# Patient Record
Sex: Male | Born: 1955 | Race: Black or African American | Hispanic: No | Marital: Married | Smoking: Current every day smoker
Health system: Southern US, Community
[De-identification: ages and names within clinical notes are randomized; demographics above are authoritative.]

## PROBLEM LIST (undated history)

## (undated) DIAGNOSIS — I10 Essential (primary) hypertension: Secondary | ICD-10-CM

## (undated) HISTORY — PX: SHOULDER SURGERY: SHX246

---

## 2014-06-20 ENCOUNTER — Emergency Department (HOSPITAL_BASED_OUTPATIENT_CLINIC_OR_DEPARTMENT_OTHER): Payer: Worker's Compensation

## 2014-06-20 ENCOUNTER — Encounter (HOSPITAL_BASED_OUTPATIENT_CLINIC_OR_DEPARTMENT_OTHER): Payer: Self-pay | Admitting: Emergency Medicine

## 2014-06-20 ENCOUNTER — Emergency Department (HOSPITAL_BASED_OUTPATIENT_CLINIC_OR_DEPARTMENT_OTHER)
Admission: EM | Admit: 2014-06-20 | Discharge: 2014-06-20 | Disposition: A | Discharge status: Home / Self Care | Payer: Worker's Compensation | Attending: Emergency Medicine | Admitting: Emergency Medicine

## 2014-06-20 DIAGNOSIS — R52 Pain, unspecified: Secondary | ICD-10-CM

## 2014-06-20 DIAGNOSIS — M75102 Unspecified rotator cuff tear or rupture of left shoulder, not specified as traumatic: Secondary | ICD-10-CM | POA: Diagnosis not present

## 2014-06-20 DIAGNOSIS — M25512 Pain in left shoulder: Secondary | ICD-10-CM | POA: Diagnosis present

## 2014-06-20 DIAGNOSIS — Z720 Tobacco use: Secondary | ICD-10-CM | POA: Diagnosis not present

## 2014-06-20 DIAGNOSIS — IMO0002 Reserved for concepts with insufficient information to code with codable children: Secondary | ICD-10-CM

## 2014-06-20 LAB — CBC WITH DIFFERENTIAL/PLATELET
Basophils Absolute: 0 10*3/uL (ref 0.0–0.1)
Basophils Relative: 1 % (ref 0–1)
Eosinophils Absolute: 0.1 10*3/uL (ref 0.0–0.7)
Eosinophils Relative: 2 % (ref 0–5)
HCT: 43.3 % (ref 39.0–52.0)
HEMOGLOBIN: 14.6 g/dL (ref 13.0–17.0)
LYMPHS PCT: 37 % (ref 12–46)
Lymphs Abs: 2.3 10*3/uL (ref 0.7–4.0)
MCH: 29.6 pg (ref 26.0–34.0)
MCHC: 33.7 g/dL (ref 30.0–36.0)
MCV: 87.7 fL (ref 78.0–100.0)
Monocytes Absolute: 0.5 10*3/uL (ref 0.1–1.0)
Monocytes Relative: 8 % (ref 3–12)
NEUTROS ABS: 3.2 10*3/uL (ref 1.7–7.7)
Neutrophils Relative %: 52 % (ref 43–77)
PLATELETS: 268 10*3/uL (ref 150–400)
RBC: 4.94 MIL/uL (ref 4.22–5.81)
RDW: 13.7 % (ref 11.5–15.5)
WBC: 6.1 10*3/uL (ref 4.0–10.5)

## 2014-06-20 LAB — BASIC METABOLIC PANEL
Anion gap: 4 — ABNORMAL LOW (ref 5–15)
BUN: 11 mg/dL (ref 6–20)
CHLORIDE: 110 mmol/L (ref 101–111)
CO2: 27 mmol/L (ref 22–32)
Calcium: 8.9 mg/dL (ref 8.9–10.3)
Creatinine, Ser: 1 mg/dL (ref 0.61–1.24)
GFR calc Af Amer: >60 mL/min (ref >60)
GFR calc non Af Amer: >60 mL/min (ref >60)
Glucose, Bld: 88 mg/dL (ref 65–99)
POTASSIUM: 3.8 mmol/L (ref 3.5–5.1)
SODIUM: 141 mmol/L (ref 135–145)

## 2014-06-20 LAB — TROPONIN I

## 2014-06-20 MED ORDER — ACETAMINOPHEN 500 MG PO TABS
1000.0000 mg | ORAL_TABLET | Freq: Once | ORAL | Status: AC
  Administered 2014-06-20: 1000 mg via ORAL
  Filled 2014-06-20: qty 2

## 2014-06-20 MED ORDER — OXYCODONE-ACETAMINOPHEN 5-325 MG PO TABS: ORAL_TABLET | ORAL | Status: DC

## 2014-06-20 MED ORDER — METHOCARBAMOL 500 MG PO TABS: 1000.0000 mg | ORAL_TABLET | Freq: Four times a day (QID) | ORAL | Status: DC | PRN

## 2014-06-20 NOTE — ED Provider Notes (Signed)
CSN: [id number]     Arrival date & time 06/20/14  1344 History   First MD Initiated Contact with Patient 06/20/14 1405     Chief Complaint  Patient presents with  . Shoulder Pain     (Consider location/radiation/quality/duration/timing/severity/associated sxs/prior Treatment) HPI   Jason Bates is a 59 y.o. male complaining of severe, constant atraumatic left shoulder pain onset yesterday. Patient has taken acetaminophen and Motrin at home with little relief. Patient states pain is exacerbated by movement and palpation. He has a rotator cuff surgery by [facility] in February of this year. Has not had any issues with the shoulder since that time. He has occasional pain but nothing this severe. He denies nausea, vomiting, shortness of breath, cough, fever, history of ACS. Patient is active daily smoker.  History reviewed. No pertinent past medical history. Past Surgical History  Procedure Laterality Date  . Shoulder surgery Left    History reviewed. No pertinent family history. History  Substance Use Topics  . Smoking status: Current Every Day Smoker -- 0.50 packs/day    Types: Cigarettes  . Smokeless tobacco: Not on file  . Alcohol Use: No    Review of Systems  10 systems reviewed and found to be negative, except as noted in the HPI.  Allergies  Review of patient's allergies indicates no known allergies.  Home Medications   Prior to Admission medications   Medication Sig Start Date End Date Taking? Authorizing Provider  methocarbamol (ROBAXIN) 500 MG tablet Take 2 tablets (1,000 mg total) by mouth 4 (four) times daily as needed (Pain). 06/20/14   Calianne Larue, PA-C  oxyCODONE-acetaminophen (PERCOCET/ROXICET) 5-325 MG per tablet 1 to 2 tabs PO q6hrs  PRN for pain 06/20/14   Joni ReiningNicole Markee Matera, PA-C   BP 179/101 mmHg  Pulse 75  Temp(Src) 98.3 F (36.8 C) (Oral)  Resp 18  Ht 5\' 8"  (1.727 m)  Wt 127 lb (57.607 kg)  BMI 19.31 kg/m2  SpO2 95% Physical Exam  Constitutional:  He is oriented to person, place, and time. He appears well-developed and well-nourished. No distress.  crying  HENT:  Head: Normocephalic.  Mouth/Throat: Oropharynx is clear and moist.  Eyes: Conjunctivae and EOM are normal.  Neck: Normal range of motion. No JVD present. No tracheal deviation present.  Cardiovascular: Normal rate, regular rhythm and intact distal pulses.   Radial pulse equal bilaterally  Pulmonary/Chest: Effort normal and breath sounds normal. No stridor. No respiratory distress. He has no wheezes. He has no rales. He exhibits no tenderness.  Abdominal: Soft. He exhibits no distension and no mass. There is no tenderness. There is no rebound and no guarding.  Musculoskeletal: Normal range of motion. He exhibits tenderness. He exhibits no edema.  Left shoulder:  Shoulder with no deformity. Able to abduct to just under 90. +Anterior TTP of rotator cuff musculature. Drop arm negative. Neurovascularly intact  No calf asymmetry, superficial collaterals, palpable cords, edema, Homans sign negative bilaterally.    Neurological: He is alert and oriented to person, place, and time.  Skin: Skin is warm. He is not diaphoretic.  Psychiatric: He has a normal mood and affect.  Nursing note and vitals reviewed.   ED Course  Procedures (including critical care time) Labs Review Labs Reviewed  BASIC METABOLIC PANEL - Abnormal; Notable for the following:    Anion gap 4 (*)    All other components within normal limits  CBC WITH DIFFERENTIAL/PLATELET  TROPONIN I    Imaging Review Dg Chest 2 View  06/20/2014  CLINICAL DATA:  Left shoulder pain. Rotator cuff surgery 4 months ago. Chronic cough.  EXAM: CHEST  2 VIEW  COMPARISON:  None.  FINDINGS: The heart size and mediastinal contours are normal. The lungs are clear. There are prominent nipple shadows bilaterally. No significant osseous findings identified. Postsurgical changes noted in the proximal left humerus.  IMPRESSION: No  active cardiopulmonary process.   Electronically Signed   By: Carey Bullocks M.D.   On: 06/20/2014 15:04   Dg Shoulder Left  06/20/2014   CLINICAL DATA:  Left shoulder pain. Personal history of rotator cuff surgery.  EXAM: LEFT SHOULDER - 2+ VIEW  COMPARISON:  None.  FINDINGS: The left shoulder is located. No acute bone or soft tissue abnormalities are present. Tendon anchors are noted. The visualized left clavicle is intact. The hemi thorax is clear.  IMPRESSION: 1. Postsurgical changes. 2. No acute abnormality.   Electronically Signed   By: Marin Roberts M.D.   On: 06/20/2014 15:06     EKG Interpretation   Date/Time:  Sunday June 20 2014 13:58:51 EDT Ventricular Rate:  71 PR Interval:  182 QRS Duration: 76 QT Interval:  418 QTC Calculation: 454 R Axis:   6 Text Interpretation:  Normal sinus rhythm Right atrial enlargement Cannot  rule out Anteroseptal infarct , age undetermined Abnormal ECG No prior for  comparison Confirmed by Gwendolyn Grant  MD, BLAIR [phone]) on 06/20/2014 2:05:50 PM      MDM   Final diagnoses:  Pain  Disorder of rotator cuff syndrome of left shoulder and allied disorder   Filed Vitals:   06/20/14 1359  BP: 179/101  Pulse: 75  Temp: 98.3 F (36.8 C)  TempSrc: Oral  Resp: 18  Height:  (1.727 m)  Weight: 127 lb (57.607 kg)  SpO2: 95%    Medications  acetaminophen (TYLENOL) tablet 1,000 mg (1,000 mg Oral Given 06/20/14 1501)    Jason Bates is a pleasant 59 y.o. male presenting with severe atraumatic left shoulder pain exacerbated by palpation and movement. Patient had surgery to the affected joint in February of this year. While I think is unlikely that this could be related to cardiac issues, patient is a daily smoker so will check basic blood work, chest x-ray and mage the left shoulder. Patient will be placed in a sling. Patient is driving home so pain Rx options in the ED are limited. His blood pressure significantly elevated and that may be  secondary to pain.  EKG with no acute abnormalities, troponin is negative, blood work is negative, x-rays of the chest and shoulder also negative, will give the patient pain control and ask him to follow with Dr. Luiz Blare for further management. Extensive discussion of return precautions.  Evaluation does not show pathology that would require ongoing emergent intervention or inpatient treatment. Pt is hemodynamically stable and mentating appropriately. Discussed findings and plan with patient/guardian, who agrees with care plan. All questions answered. Return precautions discussed and outpatient follow up given.   New Prescriptions   METHOCARBAMOL (ROBAXIN) 500 MG TABLET    Take 2 tablets (1,000 mg total) by mouth 4 (four) times daily as needed (Pain).   OXYCODONE-ACETAMINOPHEN (PERCOCET/ROXICET) 5-325 MG PER TABLET    1 to 2 tabs PO q6hrs  PRN for pain         Wynetta Emery, PA-C 06/20/14 1536  Elwin Mocha, MD 06/20/14 1537

## 2014-06-20 NOTE — ED Notes (Signed)
Patient reports pain to left shoulder.  Reports he had surgery on it February 9.  Denies injury.  Reports constant pain.

## 2014-06-20 NOTE — Discharge Instructions (Signed)
Please take ibuprofen 400mg  (this is normally 2 over the counter pills) every 6 hours (take with food to minimze stomach irritation).   Take valium and/or percocet for breakthrough pain, do not drink alcohol, drive, care for children or perfom other critical tasks while taking valium and/or percocet.  Please follow with your primary care doctor in the next 2 days for a check-up. They must obtain records for further management.   Do not hesitate to return to the Emergency Department for any new, worsening or concerning symptoms.   Only use the arm sling for up to 2 days. Take the arm out and rotate the shoulder every 4 hours.

## 2014-08-07 ENCOUNTER — Encounter (HOSPITAL_BASED_OUTPATIENT_CLINIC_OR_DEPARTMENT_OTHER): Payer: Self-pay | Admitting: *Deleted

## 2014-08-07 ENCOUNTER — Emergency Department (HOSPITAL_BASED_OUTPATIENT_CLINIC_OR_DEPARTMENT_OTHER)
Admission: EM | Admit: 2014-08-07 | Discharge: 2014-08-07 | Disposition: A | Discharge status: Home / Self Care | Payer: Worker's Compensation | Attending: Emergency Medicine | Admitting: Emergency Medicine

## 2014-08-07 DIAGNOSIS — Z720 Tobacco use: Secondary | ICD-10-CM | POA: Insufficient documentation

## 2014-08-07 DIAGNOSIS — M25512 Pain in left shoulder: Secondary | ICD-10-CM | POA: Diagnosis present

## 2014-08-07 DIAGNOSIS — I10 Essential (primary) hypertension: Secondary | ICD-10-CM | POA: Insufficient documentation

## 2014-08-07 DIAGNOSIS — Z9119 Patient's noncompliance with other medical treatment and regimen: Secondary | ICD-10-CM | POA: Diagnosis not present

## 2014-08-07 DIAGNOSIS — Z9889 Other specified postprocedural states: Secondary | ICD-10-CM | POA: Diagnosis not present

## 2014-08-07 HISTORY — DX: Essential (primary) hypertension: I10

## 2014-08-07 MED ORDER — HYDROCODONE-ACETAMINOPHEN 5-325 MG PO TABS: 1.0000 | ORAL_TABLET | Freq: Four times a day (QID) | ORAL | Status: DC | PRN

## 2014-08-07 NOTE — ED Notes (Signed)
MD at bedside. 

## 2014-08-07 NOTE — ED Notes (Signed)
Pt c/o left shoulder pain x 2 months , seen here last month for same, requesting "pain med" till can get in with ortho

## 2014-08-07 NOTE — ED Provider Notes (Signed)
CSN: [id number]     Arrival date & time 08/07/14  0905 History   First MD Initiated Contact with Patient 08/07/14 [phone]     Chief Complaint  Patient presents with  . Shoulder Pain     (Consider location/radiation/quality/duration/timing/severity/associated sxs/prior Treatment) HPI Complains of left shoulder pain onset after having rotator cuff surgery February 2016. Pain is worse with movement of the shoulder improved with remaining still. No other associated symptoms. He treated himself with ibuprofen and Tylenol, without adequate pain relief. No fever no  Injury.no other associated symptoms pain is nonradiating Past Medical History  Diagnosis Date  . Hypertension    Past Surgical History  Procedure Laterality Date  . Shoulder surgery Left    History reviewed. No pertinent family history. History  Substance Use Topics  . Smoking status: Current Every Day Smoker -- 0.50 packs/day    Types: Cigarettes  . Smokeless tobacco: Not on file  . Alcohol Use: No    Review of Systems  Constitutional: Negative.   Respiratory: Negative.   Cardiovascular: Negative.   Musculoskeletal: Positive for arthralgias.       Left shoulder pain      Allergies  Review of patient's allergies indicates no known allergies.  Home Medications   Prior to Admission medications   Medication Sig Start Date End Date Taking? Authorizing Provider  HYDROcodone-acetaminophen (NORCO) 5-325 MG per tablet Take 1 tablet by mouth every 6 (six) hours as needed. 08/07/14   Doug Sou, MD  methocarbamol (ROBAXIN) 500 MG tablet Take 2 tablets (1,000 mg total) by mouth 4 (four) times daily as needed (Pain). 06/20/14   Nicole Pisciotta, PA-C  oxyCODONE-acetaminophen (PERCOCET/ROXICET) 5-325 MG per tablet 1 to 2 tabs PO q6hrs  PRN for pain 06/20/14   Joni Reining Pisciotta, PA-C   BP 160/118 mmHg  Pulse 76  Temp(Src) 98.2 F (36.8 C) (Oral)  Resp 16  Ht  (1.727 m)  Wt 130 lb (58.968 kg)  BMI 19.77 kg/m2  SpO2  98% Physical Exam  Constitutional: He appears well-developed and well-nourished. No distress.  HENT:  Head: Normocephalic and atraumatic.  Eyes: Conjunctivae are normal. Pupils are equal, round, and reactive to light.  Neck: Neck supple. No tracheal deviation present. No thyromegaly present.  Cardiovascular: Normal rate and regular rhythm.   No murmur heard. Pulmonary/Chest: Effort normal and breath sounds normal.  Abdominal: Soft. Bowel sounds are normal. He exhibits no distension. There is no tenderness.  Musculoskeletal: Normal range of motion. He exhibits no edema or tenderness.  Left upper extremity, no swelling no redness limited abduction of shoulder secondary to pain. He has full flexion, adduction, and extension with some pain on movement. Radial pulse 2+. All other extremities without redness swelling or tenderness neurovascularly intact  Neurological: He is alert. Coordination normal.  Skin: Skin is warm and dry. No rash noted.  Psychiatric: He has a normal mood and affect.  Nursing note and vitals reviewed.   ED Course  Procedures (including critical care time) Labs Review Labs Reviewed - No data to display  Imaging Review No results found.   EKG Interpretation None      MDM  A she is been noncompliant with blood pressure medicine for one month. He does not know if he has refills and has not contacted his primary care physician. He was advised as to risk of heart attack and stroke from not taking blood pressure medication and advised to contact his primary care physician in 2 days for refills and blood  pressure recheck.plan prescription Norco Diagnosis #1 chronic right shoulder pain #2 hypertension #3 medication noncompliance Final diagnoses:  Left shoulder pain        Doug Sou, MD 08/07/14 [phone]

## 2014-08-07 NOTE — Discharge Instructions (Signed)
Call your orthopedic surgeon at [facility] in 2 days to schedule the next available appointment in the office. Take the pain medicine prescribed for severe pain or Tylenol for mild pain. Don't take Tylenol together with the pain medicine prescribed as the combination can be dangerous. Contact your primary care physician at [facility] in 2 days to get a refill of your blood pressure medication. You blood pressure should be rechecked within the next one or 2 weeks. Today's was elevated at 160/118

## 2014-10-04 ENCOUNTER — Emergency Department (HOSPITAL_BASED_OUTPATIENT_CLINIC_OR_DEPARTMENT_OTHER)
Admission: EM | Admit: 2014-10-04 | Discharge: 2014-10-04 | Disposition: A | Discharge status: Home / Self Care | Payer: Worker's Compensation | Attending: Emergency Medicine | Admitting: Emergency Medicine

## 2014-10-04 ENCOUNTER — Encounter (HOSPITAL_BASED_OUTPATIENT_CLINIC_OR_DEPARTMENT_OTHER): Payer: Self-pay | Admitting: *Deleted

## 2014-10-04 ENCOUNTER — Emergency Department (HOSPITAL_BASED_OUTPATIENT_CLINIC_OR_DEPARTMENT_OTHER): Payer: Worker's Compensation

## 2014-10-04 DIAGNOSIS — Y998 Other external cause status: Secondary | ICD-10-CM | POA: Insufficient documentation

## 2014-10-04 DIAGNOSIS — M25512 Pain in left shoulder: Secondary | ICD-10-CM

## 2014-10-04 DIAGNOSIS — Y9289 Other specified places as the place of occurrence of the external cause: Secondary | ICD-10-CM | POA: Diagnosis not present

## 2014-10-04 DIAGNOSIS — Z720 Tobacco use: Secondary | ICD-10-CM | POA: Diagnosis not present

## 2014-10-04 DIAGNOSIS — G8929 Other chronic pain: Secondary | ICD-10-CM | POA: Insufficient documentation

## 2014-10-04 DIAGNOSIS — I10 Essential (primary) hypertension: Secondary | ICD-10-CM | POA: Diagnosis not present

## 2014-10-04 DIAGNOSIS — X58XXXA Exposure to other specified factors, initial encounter: Secondary | ICD-10-CM | POA: Insufficient documentation

## 2014-10-04 DIAGNOSIS — Z9889 Other specified postprocedural states: Secondary | ICD-10-CM | POA: Diagnosis not present

## 2014-10-04 DIAGNOSIS — S4992XA Unspecified injury of left shoulder and upper arm, initial encounter: Secondary | ICD-10-CM | POA: Insufficient documentation

## 2014-10-04 DIAGNOSIS — Y9389 Activity, other specified: Secondary | ICD-10-CM | POA: Diagnosis not present

## 2014-10-04 NOTE — Discharge Instructions (Signed)
Follow-up with your orthopedist. You may continue to take over-the-counter medications including naproxen, ibuprofen or Tylenol for your pain.  Chronic Pain Chronic pain can be defined as pain that is off and on and lasts for 3-6 months or longer. Many things cause chronic pain, which can make it difficult to make a diagnosis. There are many treatment options available for chronic pain. However, finding a treatment that works well for you may require trying various approaches until the right one is found. Many people benefit from a combination of two or more types of treatment to control their pain. SYMPTOMS  Chronic pain can occur anywhere in the body and can range from mild to very severe. Some types of chronic pain include:  Headache.  Low back pain.  Cancer pain.  Arthritis pain.  Neurogenic pain. This is pain resulting from damage to nerves. People with chronic pain may also have other symptoms such as:  Depression.  Anger.  Insomnia.  Anxiety. DIAGNOSIS  Your health care provider will help diagnose your condition over time. In many cases, the initial focus will be on excluding possible conditions that could be causing the pain. Depending on your symptoms, your health care provider may order tests to diagnose your condition. Some of these tests may include:   Blood tests.   CT scan.   MRI.   X-rays.   Ultrasounds.   Nerve conduction studies.  You may need to see a specialist.  TREATMENT  Finding treatment that works well may take time. You may be referred to a pain specialist. He or she may prescribe medicine or therapies, such as:   Mindful meditation or yoga.  Shots (injections) of numbing or pain-relieving medicines into the spine or area of pain.  Local electrical stimulation.  Acupuncture.   Massage therapy.   Aroma, color, light, or sound therapy.   Biofeedback.   Working with a physical therapist to keep from getting stiff.   Regular,  gentle exercise.   Cognitive or behavioral therapy.   Group support.  Sometimes, surgery may be recommended.  HOME CARE INSTRUCTIONS   Take all medicines as directed by your health care provider.   Lessen stress in your life by relaxing and doing things such as listening to calming music.   Exercise or be active as directed by your health care provider.   Eat a healthy diet and include things such as vegetables, fruits, fish, and lean meats in your diet.   Keep all follow-up appointments with your health care provider.   Attend a support group with others suffering from chronic pain. SEEK MEDICAL CARE IF:   Your pain gets worse.   You develop a new pain that was not there before.   You cannot tolerate medicines given to you by your health care provider.   You have new symptoms since your last visit with your health care provider.  SEEK IMMEDIATE MEDICAL CARE IF:   You feel weak.   You have decreased sensation or numbness.   You lose control of bowel or bladder function.   Your pain suddenly gets much worse.   You develop shaking.  You develop chills.  You develop confusion.  You develop chest pain.  You develop shortness of breath.  MAKE SURE YOU:  Understand these instructions.  Will watch your condition.  Will get help right away if you are not doing well or get worse. Document Released: 09/23/2001 Document Revised: 09/03/2012 Document Reviewed: 06/27/2012 [facility] Patient Information 2015 [place], [place]. This  information is not intended to replace advice given to you by your health care provider. Make sure you discuss any questions you have with your health care provider.  Shoulder Pain The shoulder is the joint that connects your arms to your body. The bones that form the shoulder joint include the upper arm bone (humerus), the shoulder blade (scapula), and the collarbone (clavicle). The top of the humerus is shaped like a ball and fits  into a rather flat socket on the scapula (glenoid cavity). A combination of muscles and strong, fibrous tissues that connect muscles to bones (tendons) support your shoulder joint and hold the ball in the socket. Small, fluid-filled sacs (bursae) are located in different areas of the joint. They act as cushions between the bones and the overlying soft tissues and help reduce friction between the gliding tendons and the bone as you move your arm. Your shoulder joint allows a wide range of motion in your arm. This range of motion allows you to do things like scratch your back or throw a ball. However, this range of motion also makes your shoulder more prone to pain from overuse and injury. Causes of shoulder pain can originate from both injury and overuse and usually can be grouped in the following four categories:  Redness, swelling, and pain (inflammation) of the tendon (tendinitis) or the bursae (bursitis).  Instability, such as a dislocation of the joint.  Inflammation of the joint (arthritis).  Broken bone (fracture). HOME CARE INSTRUCTIONS   Apply ice to the sore area.  Put ice in a plastic bag.  Place a towel between your skin and the bag.  Leave the ice on for 15-20 minutes, 3-4 times per day for the first 2 days, or as directed by your health care provider.  Stop using cold packs if they do not help with the pain.  If you have a shoulder sling or immobilizer, wear it as long as your caregiver instructs. Only remove it to shower or bathe. Move your arm as little as possible, but keep your hand moving to prevent swelling.  Squeeze a soft ball or foam pad as much as possible to help prevent swelling.  Only take over-the-counter or prescription medicines for pain, discomfort, or fever as directed by your caregiver. SEEK MEDICAL CARE IF:   Your shoulder pain increases, or new pain develops in your arm, hand, or fingers.  Your hand or fingers become cold and numb.  Your pain is not  relieved with medicines. SEEK IMMEDIATE MEDICAL CARE IF:   Your arm, hand, or fingers are numb or tingling.  Your arm, hand, or fingers are significantly swollen or turn white or blue. MAKE SURE YOU:   Understand these instructions.  Will watch your condition.  Will get help right away if you are not doing well or get worse. Document Released: 10/11/2004 Document Revised: 05/18/2013 Document Reviewed: 12/16/2010 [facility] Patient Information 2015 [place], [place]. This information is not intended to replace advice given to you by your health care provider. Make sure you discuss any questions you have with your health care provider.

## 2014-10-04 NOTE — ED Notes (Signed)
Pt c/o left shoulder pain since his surgery in Feb of this year.

## 2014-10-04 NOTE — ED Provider Notes (Signed)
CSN: [id number]     Arrival date & time 10/04/14  1478 History   First MD Initiated Contact with Patient 10/04/14 1021     Chief Complaint  Patient presents with  . Shoulder Pain     (Consider location/radiation/quality/duration/timing/severity/associated sxs/prior Treatment) HPI Comments: 59 year old male presenting with left shoulder pain worsening over the last 2 days after reaching for something in a cabinet and hearing a "pop". Had surgery on his left shoulder by Dr. Luiz Blare in February and reports ongoing pain since. Previously was taking Percocet and muscle relaxers which he no longer has. Has tried taking over-the-counter medications with no relief. Pain currently 10/10, worse with any movement and radiates throughout his entire shoulder. Denies extremity numbness or tingling.  Patient is a 59 y.o. male presenting with shoulder pain. The history is provided by the patient.  Shoulder Pain   Past Medical History  Diagnosis Date  . Hypertension    Past Surgical History  Procedure Laterality Date  . Shoulder surgery Left    No family history on file. Social History  Substance Use Topics  . Smoking status: Current Every Day Smoker -- 0.50 packs/day    Types: Cigarettes  . Smokeless tobacco: None  . Alcohol Use: No    Review of Systems  Musculoskeletal:       + L shoulder pain.  All other systems reviewed and are negative.     Allergies  Review of patient's allergies indicates no known allergies.  Home Medications   Prior to Admission medications   Medication Sig Start Date End Date Taking? Authorizing Jason Bates  HYDROcodone-acetaminophen (NORCO) 5-325 MG per tablet Take 1 tablet by mouth every 6 (six) hours as needed. 08/07/14   Doug Sou, MD  methocarbamol (ROBAXIN) 500 MG tablet Take 2 tablets (1,000 mg total) by mouth 4 (four) times daily as needed (Pain). 06/20/14   Nicole Pisciotta, PA-C  oxyCODONE-acetaminophen (PERCOCET/ROXICET) 5-325 MG per tablet 1 to 2  tabs PO q6hrs  PRN for pain 06/20/14   Joni Reining Pisciotta, PA-C   BP 160/104 mmHg  Pulse 78  Temp(Src) 98 F (36.7 C)  Resp 16  Ht 5' 8.5" (1.74 m)  Wt 132 lb (59.875 kg)  BMI 19.78 kg/m2  SpO2 97% Physical Exam  Constitutional: He is oriented to person, place, and time. He appears well-developed and well-nourished. No distress.  HENT:  Head: Normocephalic and atraumatic.  Eyes: Conjunctivae and EOM are normal.  Neck: Normal range of motion. Neck supple.  Cardiovascular: Normal rate, regular rhythm and normal heart sounds.   Pulmonary/Chest: Effort normal and breath sounds normal.  Musculoskeletal: He exhibits no edema.  L shoulder- TTP throughout. No swelling or deformity. Decreased active ROM due to pain. Full passive ROM. No specific point tenderness other than complete generalized tenderness. Elbow and wrist normal. +2 radial pulse. Normal grip strength.  Neurological: He is alert and oriented to person, place, and time.  Skin: Skin is warm and dry.  Psychiatric: He has a normal mood and affect. His behavior is normal.  Nursing note and vitals reviewed.   ED Course  Procedures (including critical care time) Labs Review Labs Reviewed - No data to display  Imaging Review Dg Shoulder Left  10/04/2014   CLINICAL DATA:  Pt c/o left shoulder pain for several days, pt denied any known injury or trauma to left shoulder, pt has a hx of left shoulder surgery  EXAM: LEFT SHOULDER - 2+ VIEW  COMPARISON:  06/20/2014  FINDINGS: Stable tendon anchors. Minimal  acromioclavicular hypertrophy. No glenohumeral abnormality.  IMPRESSION: Stable postsurgical change with no acute findings   Electronically Signed   By: Esperanza Heir M.D.   On: 10/04/2014 11:04   I have personally reviewed and evaluated these images and lab results as part of my medical decision-making.   EKG Interpretation None      MDM   Final diagnoses:  Chronic left shoulder pain   Neurovascularly intact distally. This is  ongoing chronic pain since February. Reported a new injury when reaching for ago. X-ray without acute finding. Has generalized tenderness throughout his entire shoulder and will not perform range of motion on his own, however I can move his shoulder around without any difficulty and does not appear to be in pain when distracted. Advised the patient to follow-up with his orthopedist can continue over-the-counter medications for pain. Stable for discharge. Return precautions given. Patient states understanding of treatment care plan and is agreeable.  Kathrynn Speed, PA-C 10/04/14 1127  Benjiman Core, MD 10/05/14 [phone]

## 2015-07-29 ENCOUNTER — Other Ambulatory Visit: Payer: Self-pay | Admitting: Orthopaedic Surgery

## 2015-07-29 DIAGNOSIS — M25512 Pain in left shoulder: Secondary | ICD-10-CM

## 2015-08-05 ENCOUNTER — Ambulatory Visit
Admission: RE | Admit: 2015-08-05 | Discharge: 2015-08-05 | Disposition: A | Discharge status: Home / Self Care | Payer: Worker's Compensation | Source: Ambulatory Visit | Attending: Orthopaedic Surgery | Admitting: Orthopaedic Surgery

## 2015-08-05 DIAGNOSIS — M25512 Pain in left shoulder: Secondary | ICD-10-CM

## 2016-04-20 ENCOUNTER — Encounter (HOSPITAL_BASED_OUTPATIENT_CLINIC_OR_DEPARTMENT_OTHER): Payer: Self-pay | Admitting: *Deleted

## 2016-04-20 ENCOUNTER — Emergency Department (HOSPITAL_BASED_OUTPATIENT_CLINIC_OR_DEPARTMENT_OTHER)
Admission: EM | Admit: 2016-04-20 | Discharge: 2016-04-20 | Disposition: A | Discharge status: Home / Self Care | Payer: 59 | Attending: Emergency Medicine | Admitting: Emergency Medicine

## 2016-04-20 DIAGNOSIS — R21 Rash and other nonspecific skin eruption: Secondary | ICD-10-CM | POA: Diagnosis not present

## 2016-04-20 DIAGNOSIS — I10 Essential (primary) hypertension: Secondary | ICD-10-CM | POA: Diagnosis not present

## 2016-04-20 DIAGNOSIS — F17210 Nicotine dependence, cigarettes, uncomplicated: Secondary | ICD-10-CM | POA: Diagnosis not present

## 2016-04-20 MED ORDER — HYDROXYZINE HCL 25 MG PO TABS: 25.0000 mg | ORAL_TABLET | Freq: Three times a day (TID) | ORAL | 0 refills | Status: DC | PRN

## 2016-04-20 NOTE — ED Provider Notes (Signed)
Emergency Department Provider Note  By signing my name below, I, Soijett Blue, attest that this documentation has been prepared under the direction and in the presence of Maia Plan, MD. Electronically Signed: Soijett Blue, ED Scribe. 04/20/16. 9:11 PM.  I have reviewed the triage vital signs and the nursing notes.   HISTORY  Chief Complaint Rash   HPI Jason Bates is a 61 y.o. male with a PMHx of HTN, who presents to the Emergency Department complaining of pruritic generalized rash onset 1 week ago. Pt has not tried any medications for the relief of his symptoms. He states that his wife was recently diagnosed and treated with bed bugs, but the pt is unsure if his wife's symptoms fully resolved. Pt also notes that he is currently staying in a hotel. Denies PMHx of bed bugs. Denies fever, chills, and any other symptoms.     Past Medical History:  Diagnosis Date  . Hypertension     There are no active problems to display for this patient.   Past Surgical History:  Procedure Laterality Date  . SHOULDER SURGERY Left     Current Outpatient Rx  . Order #: [id number] Class: Print  . Order #: [id number] Class: Print  . Order #: [id number] Class: Print  . Order #: [id number] Class: Print    Allergies Patient has no known allergies.  No family history on file.  Social History Social History  Substance Use Topics  . Smoking status: Current Every Day Smoker    Packs/day: 0.50    Types: Cigarettes  . Smokeless tobacco: Never Used  . Alcohol use No    Review of Systems Constitutional: No fever/chills Eyes: No visual changes. ENT: No sore throat. Cardiovascular: Denies chest pain. Respiratory: Denies shortness of breath. Gastrointestinal: No abdominal pain.  No nausea, no vomiting.  No diarrhea.  No constipation. Genitourinary: Negative for dysuria. Musculoskeletal: Negative for back pain. Skin: +Pruritic rash. Neurological: Negative for headaches, focal weakness  or numbness.  10-point ROS otherwise negative.  ____________________________________________   PHYSICAL EXAM:  VITAL SIGNS: ED Triage Vitals  Enc Vitals Group     BP 04/20/16 2034 (!) 172/109     Pulse Rate 04/20/16 2034 73     Resp 04/20/16 2034 18     Temp 04/20/16 2034 98.3 F (36.8 C)     Temp Source 04/20/16 2034 Oral     SpO2 04/20/16 2034 97 %     Weight 04/20/16 2032 131 lb (59.4 kg)     Height 04/20/16 2032  (1.727 m)   Constitutional: Alert and oriented. Well appearing and in no acute distress. Eyes: Conjunctivae are normal.  Head: Atraumatic. Nose: No congestion/rhinnorhea. Mouth/Throat: Mucous membranes are moist.  Neck: No stridor.   Cardiovascular: Normal rate, regular rhythm. Good peripheral circulation. Grossly normal heart sounds.   Respiratory: Normal respiratory effort.  No retractions. Lungs CTAB. Gastrointestinal: Soft and nontender. No distention.  Musculoskeletal: No lower extremity tenderness nor edema. No gross deformities of extremities. Neurologic:  Normal speech and language. No gross focal neurologic deficits are appreciated.  Skin:  Skin is warm, dry and intact. Positive erythematous rash over arms, legs, and trunk. No purpura. No drainage or cellulitis.    ____________________________________________   PROCEDURES  Procedure(s) performed:   Procedures  None ____________________________________________   INITIAL IMPRESSION / ASSESSMENT AND PLAN / ED COURSE  Pertinent labs & imaging results that were available during my care of the patient were reviewed by me and considered in my medical decision making (  see chart for details).  Patient presents to the ED with rash consistent with bedbugs. Has pictures of bugs in his mattress and reports his wife was also recently diagnosed. Discussed treatment of this with him in detail. Provided Atarax for itching. No evidence to suggest other type of rash such as scabies.   At this time, I do  not feel there is any life-threatening condition present. I have reviewed and discussed all results (EKG, imaging, lab, urine as appropriate), exam findings with patient. I have reviewed nursing notes and appropriate previous records.  I feel the patient is safe to be discharged home without further emergent workup. Discussed usual and customary return precautions. Patient and family (if present) verbalize understanding and are comfortable with this plan.  Patient will follow-up with their primary care provider. If they do not have a primary care provider, information for follow-up has been provided to them. All questions have been answered.    ____________________________________________  FINAL CLINICAL IMPRESSION(S) / ED DIAGNOSES  Final diagnoses:  Rash and nonspecific skin eruption     MEDICATIONS GIVEN DURING THIS VISIT:  None  NEW OUTPATIENT MEDICATIONS STARTED DURING THIS VISIT:  Discharge Medication List as of 04/20/2016  9:11 PM    START taking these medications   Details  hydrOXYzine (ATARAX/VISTARIL) 25 MG tablet Take 1 tablet (25 mg total) by mouth every 8 (eight) hours as needed for itching., Starting Fri 04/20/2016, Print       I personally performed the services described in this documentation, which was scribed in my presence. The recorded information has been reviewed and is accurate.    Note:  This document was prepared using Dragon voice recognition software and may include unintentional dictation errors.  Alona Bene, MD Emergency Medicine    Maia Plan, MD 04/23/16 [phone]

## 2016-04-20 NOTE — Discharge Instructions (Signed)
You were seen in the ED today with a rash that is likely due to bedbugs. Follow the instructions to get rid of these and the bites will stop. I have provided medication for itching to use in the mean time.

## 2016-04-20 NOTE — ED Triage Notes (Signed)
States he stays in a hotel that he thinks has bed bugs. He has a itchy rash over his body. His wife was treated for bed bugs a few weeks ago.

## 2017-08-08 ENCOUNTER — Emergency Department (HOSPITAL_BASED_OUTPATIENT_CLINIC_OR_DEPARTMENT_OTHER)
Admission: EM | Admit: 2017-08-08 | Discharge: 2017-08-08 | Disposition: A | Discharge status: Home / Self Care | Payer: 59 | Attending: Emergency Medicine | Admitting: Emergency Medicine

## 2017-08-08 ENCOUNTER — Emergency Department (HOSPITAL_BASED_OUTPATIENT_CLINIC_OR_DEPARTMENT_OTHER): Payer: 59

## 2017-08-08 ENCOUNTER — Other Ambulatory Visit: Payer: Self-pay

## 2017-08-08 ENCOUNTER — Encounter (HOSPITAL_BASED_OUTPATIENT_CLINIC_OR_DEPARTMENT_OTHER): Payer: Self-pay | Admitting: *Deleted

## 2017-08-08 DIAGNOSIS — I10 Essential (primary) hypertension: Secondary | ICD-10-CM | POA: Insufficient documentation

## 2017-08-08 DIAGNOSIS — T7840XA Allergy, unspecified, initial encounter: Secondary | ICD-10-CM | POA: Insufficient documentation

## 2017-08-08 DIAGNOSIS — T782XXA Anaphylactic shock, unspecified, initial encounter: Secondary | ICD-10-CM | POA: Insufficient documentation

## 2017-08-08 DIAGNOSIS — T63441A Toxic effect of venom of bees, accidental (unintentional), initial encounter: Secondary | ICD-10-CM | POA: Insufficient documentation

## 2017-08-08 DIAGNOSIS — F17210 Nicotine dependence, cigarettes, uncomplicated: Secondary | ICD-10-CM | POA: Insufficient documentation

## 2017-08-08 LAB — CBC WITH DIFFERENTIAL/PLATELET
BASOS PCT: 0 %
Basophils Absolute: 0 10*3/uL (ref 0.0–0.1)
EOS ABS: 0 10*3/uL (ref 0.0–0.7)
Eosinophils Relative: 0 %
HCT: 46.8 % (ref 39.0–52.0)
Hemoglobin: 16.3 g/dL (ref 13.0–17.0)
Lymphocytes Relative: 46 %
Lymphs Abs: 2.5 10*3/uL (ref 0.7–4.0)
MCH: 29.3 pg (ref 26.0–34.0)
MCHC: 34.8 g/dL (ref 30.0–36.0)
MCV: 84.2 fL (ref 78.0–100.0)
Monocytes Absolute: 0.6 10*3/uL (ref 0.1–1.0)
Monocytes Relative: 10 %
Neutro Abs: 2.4 10*3/uL (ref 1.7–7.7)
Neutrophils Relative %: 44 %
Platelets: 360 10*3/uL (ref 150–400)
RBC: 5.56 MIL/uL (ref 4.22–5.81)
RDW: 13.9 % (ref 11.5–15.5)
WBC: 5.5 10*3/uL (ref 4.0–10.5)

## 2017-08-08 LAB — COMPREHENSIVE METABOLIC PANEL
ALBUMIN: 3.8 g/dL (ref 3.5–5.0)
ALT: 21 U/L (ref 0–44)
ANION GAP: 10 (ref 5–15)
AST: 28 U/L (ref 15–41)
Alkaline Phosphatase: 117 U/L (ref 38–126)
BUN: 29 mg/dL — ABNORMAL HIGH (ref 6–20)
CO2: 23 mmol/L (ref 22–32)
Calcium: 8.7 mg/dL — ABNORMAL LOW (ref 8.9–10.3)
Chloride: 109 mmol/L (ref 98–111)
Creatinine, Ser: 1.68 mg/dL — ABNORMAL HIGH (ref 0.61–1.24)
GFR calc Af Amer: 50 mL/min — ABNORMAL LOW (ref >60)
GFR calc non Af Amer: 43 mL/min — ABNORMAL LOW (ref >60)
GLUCOSE: 136 mg/dL — AB (ref 70–99)
POTASSIUM: 3.5 mmol/L (ref 3.5–5.1)
SODIUM: 142 mmol/L (ref 135–145)
TOTAL PROTEIN: 7.1 g/dL (ref 6.5–8.1)
Total Bilirubin: 0.6 mg/dL (ref 0.3–1.2)

## 2017-08-08 LAB — TROPONIN I: Troponin I: <0.03 ng/mL (ref <0.03)

## 2017-08-08 MED ORDER — PREDNISONE 50 MG PO TABS: 50.0000 mg | ORAL_TABLET | Freq: Every day | ORAL | 0 refills | Status: AC

## 2017-08-08 MED ORDER — DIPHENHYDRAMINE HCL 50 MG/ML IJ SOLN
25.0000 mg | Freq: Once | INTRAMUSCULAR | Status: AC
  Administered 2017-08-08: 25 mg via INTRAVENOUS
  Filled 2017-08-08: qty 1

## 2017-08-08 MED ORDER — ALBUTEROL SULFATE (2.5 MG/3ML) 0.083% IN NEBU
INHALATION_SOLUTION | RESPIRATORY_TRACT | Status: AC
  Filled 2017-08-08: qty 3

## 2017-08-08 MED ORDER — FAMOTIDINE IN NACL 20-0.9 MG/50ML-% IV SOLN
20.0000 mg | Freq: Once | INTRAVENOUS | Status: AC
  Administered 2017-08-08: 20 mg via INTRAVENOUS
  Filled 2017-08-08: qty 50

## 2017-08-08 MED ORDER — ALBUTEROL SULFATE (2.5 MG/3ML) 0.083% IN NEBU
2.5000 mg | INHALATION_SOLUTION | Freq: Once | RESPIRATORY_TRACT | Status: DC
Start: 2017-08-08 — End: 2017-08-08

## 2017-08-08 MED ORDER — METHYLPREDNISOLONE SODIUM SUCC 125 MG IJ SOLR
125.0000 mg | Freq: Once | INTRAMUSCULAR | Status: AC
  Administered 2017-08-08: 125 mg via INTRAVENOUS
  Filled 2017-08-08: qty 2

## 2017-08-08 MED ORDER — EPINEPHRINE 0.3 MG/0.3ML IJ SOAJ: 0.3000 mg | Freq: Once | INTRAMUSCULAR | 0 refills | Status: AC

## 2017-08-08 MED ORDER — PREDNISONE 50 MG PO TABS: 50.0000 mg | ORAL_TABLET | Freq: Every day | ORAL | 0 refills | Status: DC

## 2017-08-08 NOTE — ED Provider Notes (Signed)
[facility] EMERGENCY DEPARTMENT Provider Note   CSN: [id number] Arrival date & time: 08/08/17  1412     History   Chief Complaint Chief Complaint  Patient presents with  . Allergic Reaction    HPI Jason GrammesMichael Bates is a 62 y.o. male.  HPI  Patient states he was stung by multiple bees to the head, face, neck and upper extremities 1 hour prior to presentation.  Started having facial swelling.  Has taken no medications prior to arrival.  Endorses very mild dyspnea.  Denies any intraoral swelling or sensation of throat closing.  Denies chest pain or pressure.  No previous cardiac history.  No history of allergic reaction.  Past Medical History:  Diagnosis Date  . Hypertension     There are no active problems to display for this patient.   Past Surgical History:  Procedure Laterality Date  . SHOULDER SURGERY Left         Home Medications    Prior to Admission medications   Medication Sig Start Date End Date Taking? Authorizing Provider  HYDROcodone-acetaminophen (NORCO) 5-325 MG per tablet Take 1 tablet by mouth every 6 (six) hours as needed. 08/07/14   Doug SouJacubowitz, Sam, MD  hydrOXYzine (ATARAX/VISTARIL) 25 MG tablet Take 1 tablet (25 mg total) by mouth every 8 (eight) hours as needed for itching. 04/20/16   Long, Arlyss RepressJoshua G, MD  methocarbamol (ROBAXIN) 500 MG tablet Take 2 tablets (1,000 mg total) by mouth 4 (four) times daily as needed (Pain). 06/20/14   Pisciotta, Joni ReiningNicole, PA-C  oxyCODONE-acetaminophen (PERCOCET/ROXICET) 5-325 MG per tablet 1 to 2 tabs PO q6hrs  PRN for pain 06/20/14   Pisciotta, Joni ReiningNicole, PA-C  predniSONE (DELTASONE) 50 MG tablet Take 1 tablet (50 mg total) by mouth daily for 4 days. 08/09/17 08/13/17  Tegeler, Canary Brimhristopher J, MD    Family History No family history on file.  Social History Social History   Tobacco Use  . Smoking status: Current Every Day Smoker    Packs/day: 0.50    Types: Cigarettes  . Smokeless tobacco: Never Used  Substance  Use Topics  . Alcohol use: No  . Drug use: Not on file     Allergies   Patient has no known allergies.   Review of Systems Review of Systems  Constitutional: Negative for chills and fever.  HENT: Positive for facial swelling. Negative for sore throat and trouble swallowing.   Respiratory: Positive for shortness of breath. Negative for cough and wheezing.   Cardiovascular: Negative for chest pain, palpitations and leg swelling.  Gastrointestinal: Negative for abdominal pain, diarrhea, nausea and vomiting.  Musculoskeletal: Negative for back pain and myalgias.  Skin: Positive for rash.  Neurological: Negative for dizziness, syncope, weakness, light-headedness, numbness and headaches.  All other systems reviewed and are negative.    Physical Exam Updated Vital Signs BP (!) 128/101   Pulse 71   Temp 97.9 F (36.6 C) (Oral)   Resp 16   Ht 5\' 8"  (1.727 m)   Wt 63.5 kg (140 lb)   SpO2 94%   BMI 21.29 kg/m   Physical Exam  Constitutional: He is oriented to person, place, and time. He appears well-developed and well-nourished. No distress.  HENT:  Head: Normocephalic and atraumatic.  Mouth/Throat: Oropharynx is clear and moist.  Patient with left greater than right periorbital edema.  Facial swelling around the bridge of the nose and maxillary regions bilaterally.  No intraoral swelling obvious.  Eyes: Pupils are equal, round, and reactive to light. EOM  are normal.  Neck: Normal range of motion. Neck supple.  No stridor present.  Cardiovascular: Regular rhythm.  Mild tachycardia.  Pulmonary/Chest: Effort normal and breath sounds normal. No stridor. No respiratory distress. He has no wheezes. He has no rales.  No respiratory distress.  Lungs clear.  Abdominal: Soft. Bowel sounds are normal. There is no tenderness. There is no rebound and no guarding.  Musculoskeletal: Normal range of motion. He exhibits no edema or tenderness.  No lower extremity swelling, asymmetry or  tenderness.  Distal pulses are 2+.  Neurological: He is alert and oriented to person, place, and time.  Speaks in clear voice.  Moves all extremities without focal deficit.  Sensation intact.  Skin: Skin is warm and dry. Capillary refill takes less than 2 seconds. Rash noted. He is not diaphoretic. No erythema.  Diffuse urticarial rash to face, trunk and bilateral upper extremities.  Psychiatric: He has a normal mood and affect. His behavior is normal.  Nursing note and vitals reviewed.    ED Treatments / Results  Labs (all labs ordered are listed, but only abnormal results are displayed) Labs Reviewed  COMPREHENSIVE METABOLIC PANEL - Abnormal; Notable for the following components:      Result Value   Glucose, Bld 136 (*)    BUN 29 (*)    Creatinine, Ser 1.68 (*)    Calcium 8.7 (*)    GFR calc non Af Amer 43 (*)    GFR calc Af Amer 50 (*)    All other components within normal limits  CBC WITH DIFFERENTIAL/PLATELET  TROPONIN I    EKG EKG Interpretation  Date/Time:  Thursday August 08 2017 14:40:14 EDT Ventricular Rate:  92 PR Interval:    QRS Duration: 82 QT Interval:  368 QTC Calculation: 456 R Axis:   31 Text Interpretation:  Sinus rhythm Right atrial enlargement Anteroseptal infarct, old Confirmed by Ross Marcus ([id number]) on 08/10/2017 2:33:31 AM   Radiology No results found.  Procedures Procedures (including critical care time)  Medications Ordered in ED Medications  methylPREDNISolone sodium succinate (SOLU-MEDROL) 125 mg/2 mL injection 125 mg (125 mg Intravenous Given 08/08/17 1440)  diphenhydrAMINE (BENADRYL) injection 25 mg (25 mg Intravenous Given 08/08/17 1440)  famotidine (PEPCID) IVPB 20 mg premix (0 mg Intravenous Stopped 08/08/17 1704)     Initial Impression / Assessment and Plan / ED Course  I have reviewed the triage vital signs and the nursing notes.  Pertinent labs & imaging results that were available during my care of the patient were reviewed  by me and considered in my medical decision making (see chart for details).    Patient is having some improvement of facial swelling.  Mildly drowsy after Benadryl.  No difficulty breathing or sensation of throat closing. Signed out to oncoming emergency provider pending reevaluation.  After period of observation.  Final Clinical Impressions(s) / ED Diagnoses   Final diagnoses:  Allergic reaction, initial encounter  Anaphylaxis, initial encounter    ED Discharge Orders        Ordered    predniSONE (DELTASONE) 50 MG tablet  Daily,   Status:  Discontinued     08/08/17 1913    predniSONE (DELTASONE) 50 MG tablet  Daily     08/08/17 1913    EPINEPHrine 0.3 mg/0.3 mL IJ SOAJ injection   Once     08/08/17 1913       Loren Racer, MD 08/10/17 1627

## 2017-08-08 NOTE — ED Notes (Signed)
Left eye is swollen,  Right eye is slightly swollen but open.

## 2017-08-08 NOTE — ED Triage Notes (Signed)
Multiple bee stings an hour ago. His eyes are swollen shut. He feels SOB.

## 2017-08-08 NOTE — Discharge Instructions (Signed)
Your allergic reaction appears to have stabilized and has been improving during your time in the emergency department.  Based on your stable exam, we did not feel he needed epinephrine today however we are giving a prescription for to go home with.  Please also take steroids for the next 4 days starting tomorrow.  Please follow-up with your PCP and allergist for further management.  If any symptoms change or worsen, please return to the nearest emergency department.

## 2017-08-08 NOTE — ED Notes (Signed)
Pt on monitor 

## 2017-08-08 NOTE — ED Provider Notes (Signed)
4:00 PM Care assumed from Dr. Ranae PalmsYelverton.  At time of transfer care, patient is awaiting reassessment by approximately 6:30 PM to see if his symptoms continue to improve from the allergic reaction.  Patient was stung by multiple bees today and had facial swelling and tingling.  Patient did not receive that he has he did not have deeper airway concerns or shortness of breath with throat closing.  Patient received steroids and histamines.  Next  The patient continues to improve and is resting calmly, plan of care as outlined by previous team is to discharge patient with prescription for steroids for several days and PCP follow-up.  Patient also be given prescription for epinephrine.  Anticipate reassessment in several hours.  7:11 PM Patient reassessed and he continues to have some facial swelling but has had no difficulty breathing, throat swelling, oral swelling, and no shortness of breath.  He was able to eat and drink without difficulty and feels that his symptoms have been unchanged or improving.   With lack of other airway symptoms and no worsening, do not feel he needs epinephrine at this time but I do feel he needs prescription for steroids to go home with as well as an EpiPen prescription.  Patient will follow-up with his PCP for further management.  Patient understood extremely strict return precautions.  Patient also informed of his acute kidney injury for which she will follow-up with PCP.  Next  Patient had no other worsens or concerns and was discharged in good condition.  Clinical Impression: 1. Allergic reaction, initial encounter   2. Anaphylaxis, initial encounter     Disposition: Discharge  Condition: Good  I have discussed the results, Dx and Tx plan with the pt(& family if present). He/she/they expressed understanding and agree(s) with the plan. Discharge instructions discussed at great length. Strict return precautions discussed and pt &/or family have verbalized understanding  of the instructions. No further questions at time of discharge.    New Prescriptions   EPINEPHRINE 0.3 MG/0.3 ML IJ SOAJ INJECTION    Inject 0.3 mLs (0.3 mg total) into the muscle once for 1 dose.   PREDNISONE (DELTASONE) 50 MG TABLET    Take 1 tablet (50 mg total) by mouth daily for 4 days.    Follow Up: No follow-up provider specified.    Daisy Mcneel, Canary Brimhristopher J, MD 08/08/17 [phone]

## 2017-08-08 NOTE — ED Notes (Signed)
Pt states he needs to urgently have bm, assisted to restroom and given call light to call me when finished. Will give pepcid iv when pt returns to room.

## 2017-08-08 NOTE — ED Notes (Signed)
Pt was given a frozen meatloaf dinner and a drink (coke).

## 2019-12-04 IMAGING — DX DG CHEST 1V PORT
1 series · 2 of 2 positions shown · non-contrast
Comparison: CT 07/03/2017.

CLINICAL DATA: Multiple [place] stings.

EXAM:
PORTABLE CHEST 1 VIEW

[Series 1: chest ap · 0.14mm/px · 2 of 2 slices shown]
[im 1/2]
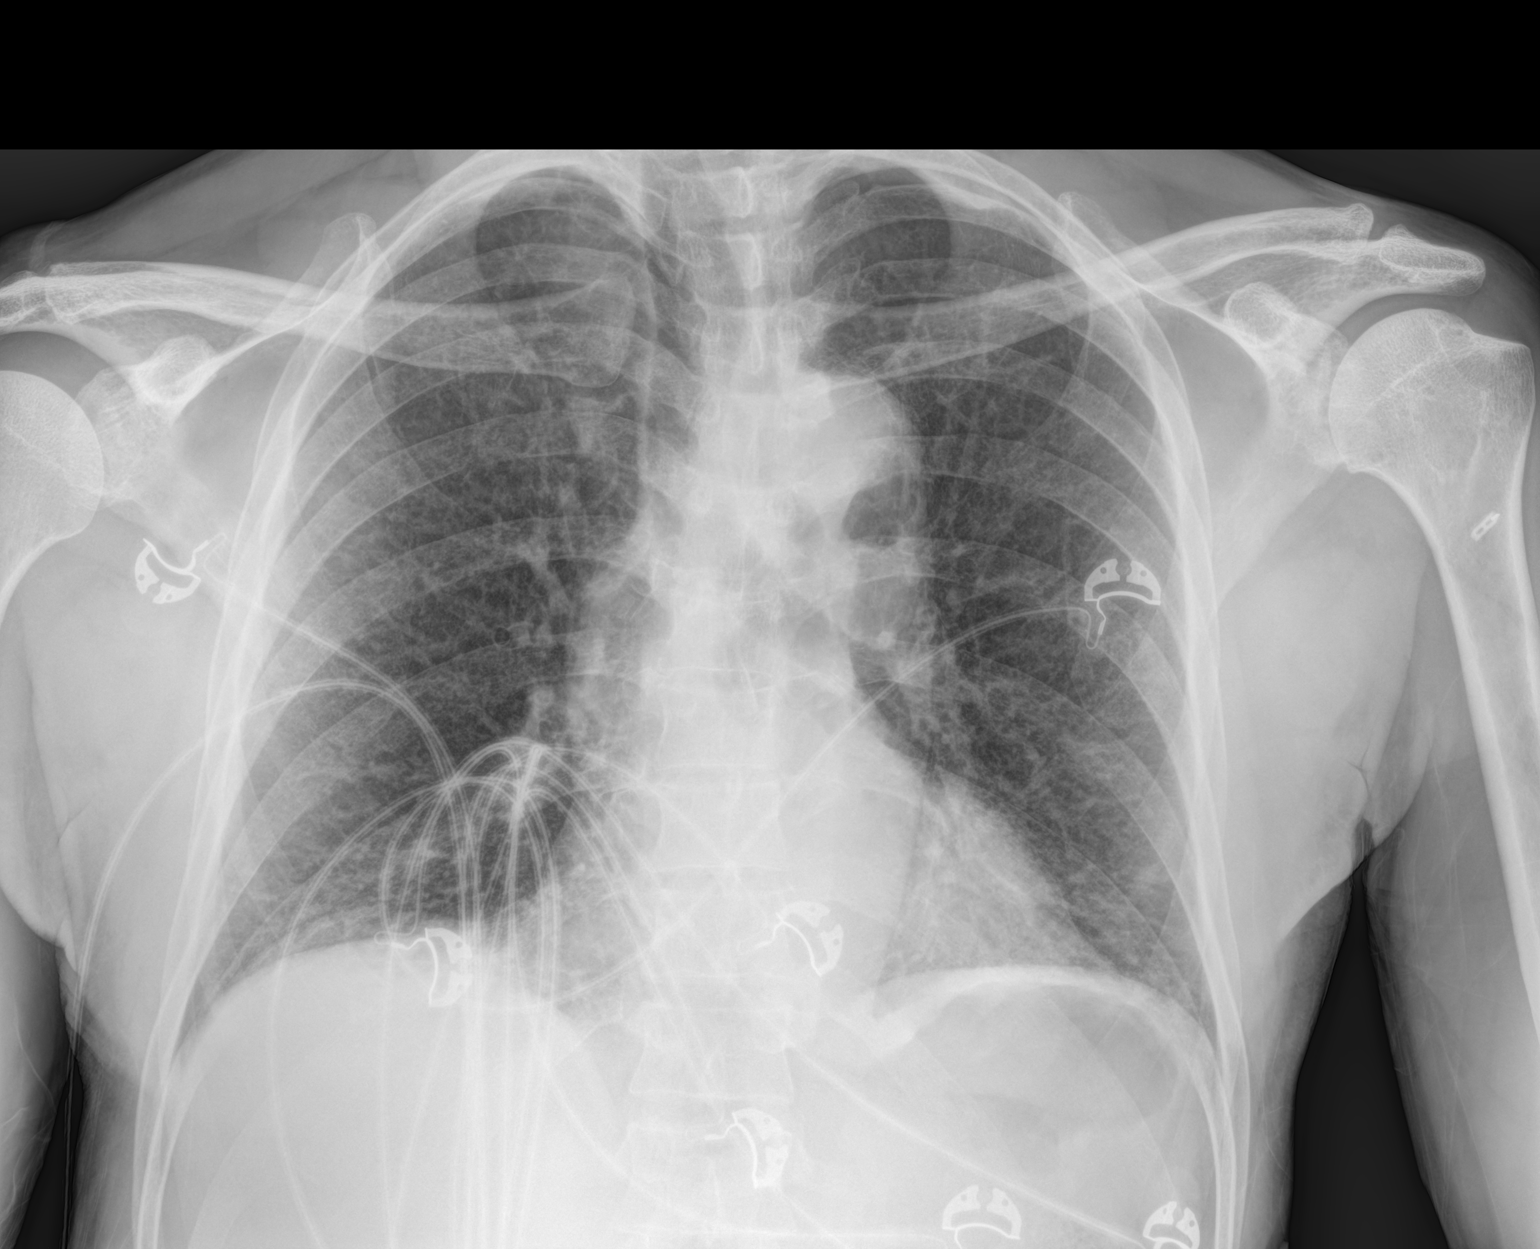
[im 2/2]
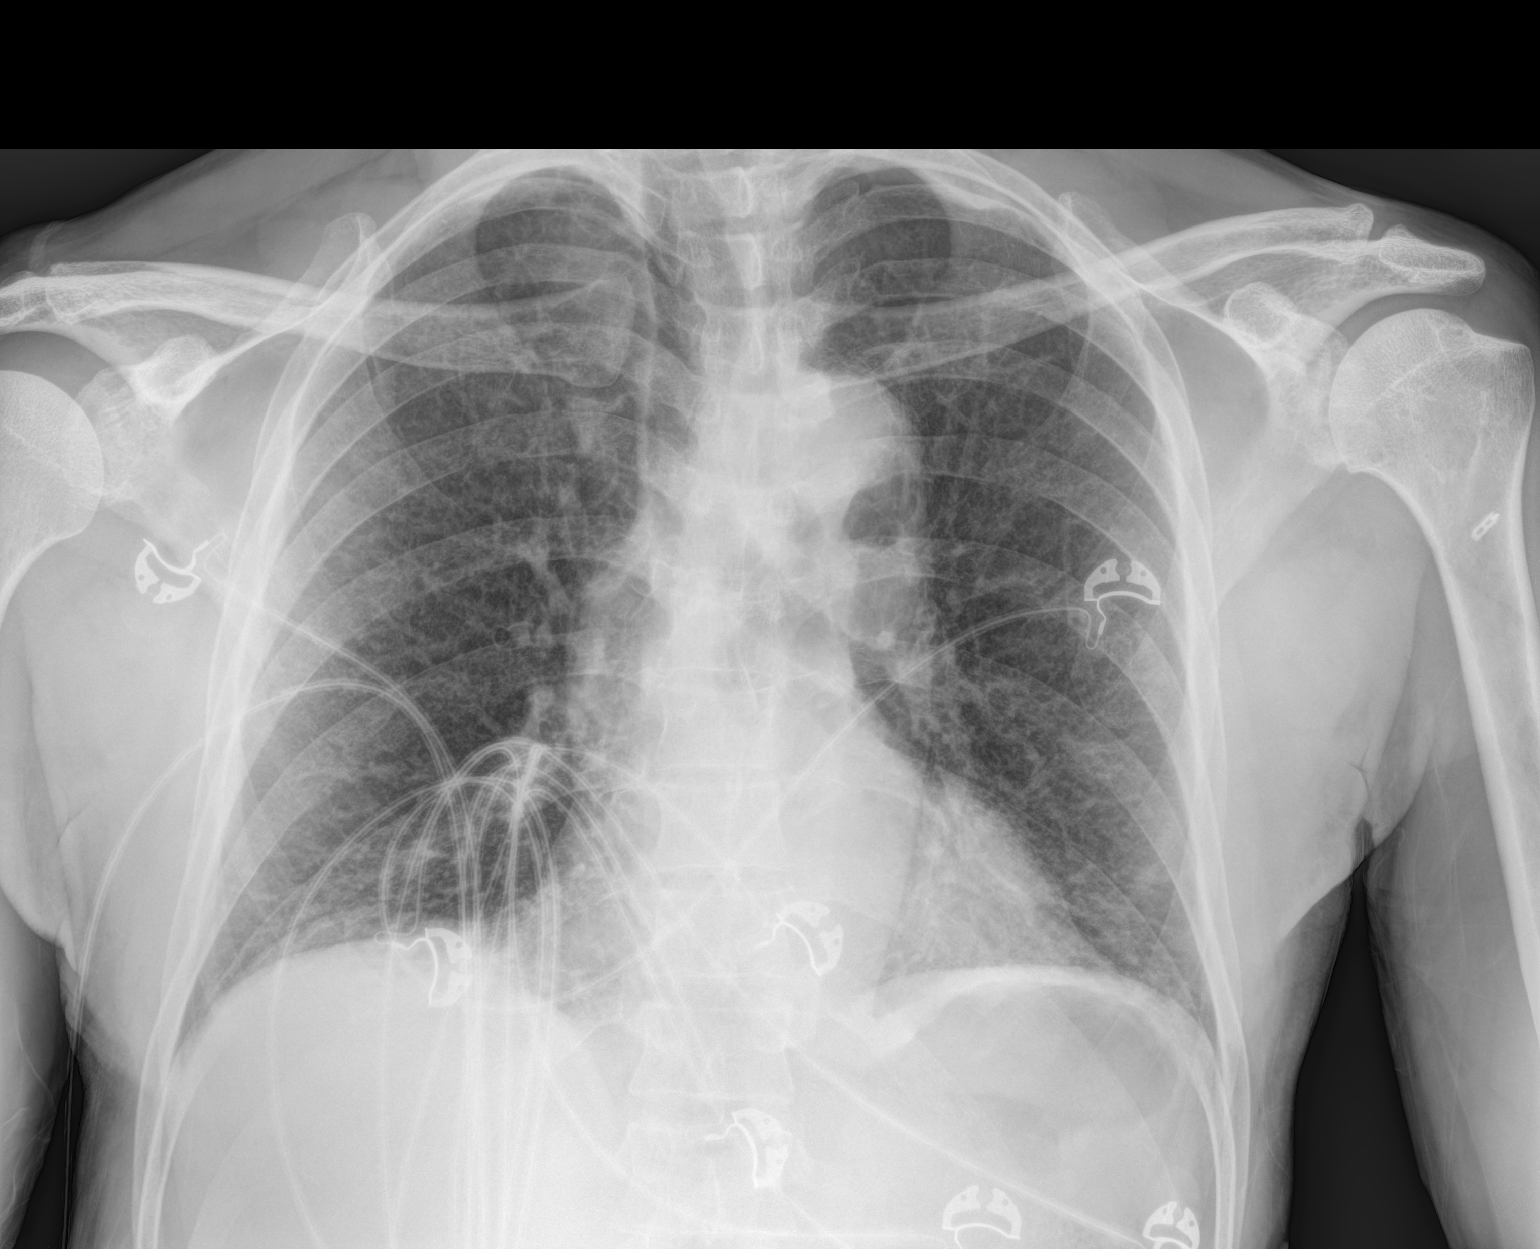

[2 of 2 positions shown; findings below may reference images not displayed]

FINDINGS: Mediastinum hilar structures normal. Low lung volumes with mild
bibasilar atelectasis. Mild bilateral interstitial prominence. Mild
interstitial edema/pneumonitis cannot be excluded. No pleural
effusion or pneumothorax. Heart size normal.
IMPRESSION: Mild bibasilar atelectasis. Mild bilateral interstitial prominence.
Mild interstitial edema/pneumonitis cannot be excluded.

## 2022-05-23 ENCOUNTER — Other Ambulatory Visit: Payer: Self-pay

## 2022-05-23 ENCOUNTER — Emergency Department (HOSPITAL_BASED_OUTPATIENT_CLINIC_OR_DEPARTMENT_OTHER)
Admission: EM | Admit: 2022-05-23 | Discharge: 2022-05-23 | Disposition: A | Discharge status: Home / Self Care | Payer: BLUE CROSS/BLUE SHIELD | Attending: Emergency Medicine | Admitting: Emergency Medicine

## 2022-05-23 ENCOUNTER — Encounter (HOSPITAL_BASED_OUTPATIENT_CLINIC_OR_DEPARTMENT_OTHER): Payer: Self-pay | Admitting: Emergency Medicine

## 2022-05-23 DIAGNOSIS — K4091 Unilateral inguinal hernia, without obstruction or gangrene, recurrent: Secondary | ICD-10-CM | POA: Diagnosis not present

## 2022-05-23 DIAGNOSIS — R10812 Left upper quadrant abdominal tenderness: Secondary | ICD-10-CM | POA: Diagnosis present

## 2022-05-23 LAB — CBC WITH DIFFERENTIAL/PLATELET
Abs Immature Granulocytes: 0.01 10*3/uL (ref 0.00–0.07)
Basophils Absolute: 0 10*3/uL (ref 0.0–0.1)
Basophils Relative: 0 %
Eosinophils Absolute: 0 10*3/uL (ref 0.0–0.5)
Eosinophils Relative: 1 %
HCT: 44.8 % (ref 39.0–52.0)
Hemoglobin: 15.3 g/dL (ref 13.0–17.0)
Immature Granulocytes: 0 %
Lymphocytes Relative: 29 %
Lymphs Abs: 1.7 10*3/uL (ref 0.7–4.0)
MCH: 29.3 pg (ref 26.0–34.0)
MCHC: 34.2 g/dL (ref 30.0–36.0)
MCV: 85.7 fL (ref 80.0–100.0)
Monocytes Absolute: 0.4 10*3/uL (ref 0.1–1.0)
Monocytes Relative: 7 %
Neutro Abs: 3.7 10*3/uL (ref 1.7–7.7)
Neutrophils Relative %: 63 %
Platelets: 272 10*3/uL (ref 150–400)
RBC: 5.23 MIL/uL (ref 4.22–5.81)
RDW: 13.7 % (ref 11.5–15.5)
WBC: 5.9 10*3/uL (ref 4.0–10.5)
nRBC: 0 % (ref 0.0–0.2)

## 2022-05-23 LAB — COMPREHENSIVE METABOLIC PANEL
ALT: 11 U/L (ref 0–44)
AST: 21 U/L (ref 15–41)
Albumin: 3.5 g/dL (ref 3.5–5.0)
Alkaline Phosphatase: 90 U/L (ref 38–126)
Anion gap: 7 (ref 5–15)
BUN: 14 mg/dL (ref 8–23)
CO2: 24 mmol/L (ref 22–32)
Calcium: 8.6 mg/dL — ABNORMAL LOW (ref 8.9–10.3)
Chloride: 106 mmol/L (ref 98–111)
Creatinine, Ser: 1.05 mg/dL (ref 0.61–1.24)
GFR, Estimated: >60 mL/min (ref >60)
Glucose, Bld: 132 mg/dL — ABNORMAL HIGH (ref 70–99)
Potassium: 3.2 mmol/L — ABNORMAL LOW (ref 3.5–5.1)
Sodium: 137 mmol/L (ref 135–145)
Total Bilirubin: 0.7 mg/dL (ref 0.3–1.2)
Total Protein: 6.4 g/dL — ABNORMAL LOW (ref 6.5–8.1)

## 2022-05-23 LAB — LIPASE, BLOOD: Lipase: 22 U/L (ref 11–51)

## 2022-05-23 NOTE — ED Notes (Signed)
Pt made aware of need for urine sample , pt stated he couldn't go at this time would try again later.

## 2022-05-23 NOTE — ED Provider Notes (Signed)
[place] EMERGENCY DEPARTMENT AT [facility] Provider Note   CSN: [id number] Arrival date & time: 05/23/22  1302     History  Chief Complaint  Patient presents with   Groin Swelling    Jason Bates is a 67 y.o. male.  Patient presents to the emergency department today for evaluation of left groin pain and bulging.  Patient does a lot of heavy lifting at his job.  He is noted that this has grown over the past 1 week.  It comes in and out.  He denies associated constipation or vomiting.  No diarrhea or blood in the stool.  Associated pain is aching in nature.  No scrotal swelling or difficulty with urination.  No history of hernias.  On further review of medical records, patient was seen at [facility] ED in early February for some groin pain.  He had a CT at that time which was negative.      Home Medications Prior to Admission medications   Not on File      Allergies    Patient has no known allergies.    Review of Systems   Review of Systems  Physical Exam Updated Vital Signs BP (!) 133/110 (BP Location: Right Arm)   Pulse 74   Temp 98.6 F (37 C)   Resp 18   Ht 5\' 8"  (1.727 m)   Wt 58.1 kg   SpO2 97%   BMI 19.46 kg/m   Physical Exam Vitals and nursing note reviewed.  Constitutional:      General: He is not in acute distress.    Appearance: He is well-developed.  HENT:     Head: Normocephalic and atraumatic.  Eyes:     General:        Right eye: No discharge.        Left eye: No discharge.     Conjunctiva/sclera: Conjunctivae normal.  Cardiovascular:     Rate and Rhythm: Normal rate and regular rhythm.     Heart sounds: Normal heart sounds.  Pulmonary:     Effort: Pulmonary effort is normal.     Breath sounds: Normal breath sounds.  Abdominal:     Palpations: Abdomen is soft.     Tenderness: There is no abdominal tenderness. There is no guarding or rebound.     Hernia: A hernia is present.     Comments: Left inguinal hernia  noted.  Patient has tenderness area.  Hernia is reducible.  No overlying redness or swelling.  No testicular/scrotal tenderness or changes.  Musculoskeletal:     Cervical back: Normal range of motion and neck supple.  Skin:    General: Skin is warm and dry.  Neurological:     Mental Status: He is alert.     ED Results / Procedures / Treatments   Labs (all labs ordered are listed, but only abnormal results are displayed) Labs Reviewed  COMPREHENSIVE METABOLIC PANEL - Abnormal; Notable for the following components:      Result Value   Potassium 3.2 (*)    Glucose, Bld 132 (*)    Calcium 8.6 (*)    Total Protein 6.4 (*)    All other components within normal limits  CBC WITH DIFFERENTIAL/PLATELET  LIPASE, BLOOD  URINALYSIS, ROUTINE W REFLEX MICROSCOPIC    EKG None  Radiology No results found.  Procedures Procedures    Medications Ordered in ED Medications - No data to display  ED Course/ Medical Decision Making/ A&P    Patient  seen and examined. History obtained directly from patient.  Reviewed outpatient emergency department medicine notes and workup.  Labs/EKG: CBC unremarkable with normal white blood cell count; CMP potassium low at 3.2, glucose 132 with normal anion gap, normal kidney function, normal transaminases; lipase normal.  Imaging: None ordered.  Considered CT imaging however I do not feel that this is necessary at this time given clinical signs and symptoms of inguinal hernia without obstruction or strangulation.  Also, CT at facility currently out of service.  Medications/Fluids: None ordered  Most recent vital signs reviewed and are as follows: BP (!) 133/110 (BP Location: Right Arm)   Pulse 74   Temp 98.6 F (37 C)   Resp 18   Ht 5\' 8"  (1.727 m)   Wt 58.1 kg   SpO2 97%   BMI 19.46 kg/m   Initial impression: Left inguinal hernia  Plan: Discharge to home.   Prescriptions written for: None  Other home care instructions discussed: OTC meds  for pain, ice, no heavy lifting bending pushing or pulling; buy a hernia belt  ED return instructions discussed:  We discussed signs and symptoms of an incarcerated or strangulated hernia and need to return if these occur.  Also, The patient was urged to return to the Emergency Department immediately with worsening of current symptoms, worsening abdominal pain, persistent vomiting, blood noted in stools, fever, or any other concerns. The patient verbalized understanding.    Follow-up instructions discussed: Patient encouraged to follow-up with surgery referral in 1 week.                               Medical Decision Making Amount and/or Complexity of Data Reviewed Labs: ordered.   For this patient's complaint of abdominal pain, the following conditions were considered on the differential diagnosis: gastritis/PUD, enteritis/duodenitis, appendicitis, cholelithiasis/cholecystitis, cholangitis, pancreatitis, ruptured viscus, colitis, diverticulitis, small/large bowel obstruction, proctitis, cystitis, pyelonephritis, ureteral colic, aortic dissection, aortic aneurysm. Atypical chest etiologies were also considered including ACS, PE, and pneumonia.  Patient clinically has a left inguinal hernia without signs of obstruction or strangulation.  No indication for emergent surgical evaluation.  Outpatient referral information given.  The patient's vital signs, pertinent lab work and imaging were reviewed and interpreted as discussed in the ED course. Hospitalization was considered for further testing, treatments, or serial exams/observation. However as patient is well-appearing, has a stable exam, and reassuring studies today, I do not feel that they warrant admission at this time. This plan was discussed with the patient who verbalizes agreement and comfort with this plan and seems reliable and able to return to the Emergency Department with worsening or changing symptoms.          Final  Clinical Impression(s) / ED Diagnoses Final diagnoses:  Unilateral recurrent inguinal hernia without obstruction or gangrene    Rx / DC Orders ED Discharge Orders     None         Renne Crigler, PA-C 05/23/22 1515    Tegeler, Canary Brim, MD 05/23/22 [phone]

## 2022-05-23 NOTE — ED Triage Notes (Addendum)
Pt w/ swelling to LT groin that "goes away when you mash it down" x 1 wk; denies urinary sxs; also reports general abd discomfort

## 2022-05-23 NOTE — Discharge Instructions (Addendum)
Please read and follow all provided instructions.  Your diagnoses today include:  1. Unilateral recurrent inguinal hernia without obstruction or gangrene     Tests performed today include: Blood cell counts and platelets: none Kidney and liver function tests: potassium was slightly low Pancreas function test (called lipase) Vital signs. See below for your results today.   Medications prescribed:  None  Take any prescribed medications only as directed.  Home care instructions:  Follow any educational materials contained in this packet. Consider getting an inguinal hernia belt to help put pressure over the hernia area to stop it from popping out  Follow-up instructions: Please call the general surgery office to schedule an appointment for evaluation of possible inguinal hernia  Return instructions:  SEEK IMMEDIATE MEDICAL ATTENTION IF: The pain does not go away or becomes severe  A temperature above 101F develops  Repeated vomiting occurs (multiple episodes)  The pain becomes localized to portions of the abdomen. The right side could possibly be appendicitis. In an adult, the left lower portion of the abdomen could be colitis or diverticulitis.  Blood is being passed in stools or vomit (bright red or black tarry stools)  You develop chest pain, difficulty breathing, dizziness or fainting, or become confused, poorly responsive, or inconsolable (young children) If you have any other emergent concerns regarding your health  Additional Information: Abdominal (belly) pain can be caused by many things. Your caregiver performed an examination and possibly ordered blood/urine tests and imaging (CT scan, x-rays, ultrasound). Many cases can be observed and treated at home after initial evaluation in the emergency department. Even though you are being discharged home, abdominal pain can be unpredictable. Therefore, you need a repeated exam if your pain does not resolve, returns, or worsens.  Most patients with abdominal pain don't have to be admitted to the hospital or have surgery, but serious problems like appendicitis and gallbladder attacks can start out as nonspecific pain. Many abdominal conditions cannot be diagnosed in one visit, so follow-up evaluations are very important.  Your vital signs today were: BP (!) 133/110 (BP Location: Right Arm)   Pulse 74   Temp 98.6 F (37 C)   Resp 18   Ht 5\' 8"  (1.727 m)   Wt 58.1 kg   SpO2 97%   BMI 19.46 kg/m  If your blood pressure (bp) was elevated above 135/85 this visit, please have this repeated by your doctor within one month. --------------

## 2022-05-23 NOTE — ED Notes (Signed)
Family at bedside.
